# Patient Record
Sex: Male | Born: 1995 | Race: Black or African American | Hispanic: No | Marital: Single | Smoking: Never smoker
Health system: Southern US, Community
[De-identification: ages and names within clinical notes are randomized; demographics above are authoritative.]

---

## 2015-12-05 ENCOUNTER — Emergency Department (HOSPITAL_COMMUNITY)
Admission: EM | Admit: 2015-12-05 | Discharge: 2015-12-05 | Disposition: A | Discharge status: Home / Self Care | Payer: Self-pay | Attending: Emergency Medicine | Admitting: Emergency Medicine

## 2015-12-05 ENCOUNTER — Emergency Department (HOSPITAL_COMMUNITY): Payer: Self-pay

## 2015-12-05 ENCOUNTER — Encounter (HOSPITAL_COMMUNITY): Payer: Self-pay | Admitting: Family Medicine

## 2015-12-05 DIAGNOSIS — R748 Abnormal levels of other serum enzymes: Secondary | ICD-10-CM

## 2015-12-05 DIAGNOSIS — B349 Viral infection, unspecified: Secondary | ICD-10-CM | POA: Insufficient documentation

## 2015-12-05 DIAGNOSIS — K0889 Other specified disorders of teeth and supporting structures: Secondary | ICD-10-CM | POA: Insufficient documentation

## 2015-12-05 DIAGNOSIS — R05 Cough: Secondary | ICD-10-CM

## 2015-12-05 DIAGNOSIS — R7989 Other specified abnormal findings of blood chemistry: Secondary | ICD-10-CM | POA: Insufficient documentation

## 2015-12-05 DIAGNOSIS — R059 Cough, unspecified: Secondary | ICD-10-CM

## 2015-12-05 DIAGNOSIS — R1084 Generalized abdominal pain: Secondary | ICD-10-CM | POA: Insufficient documentation

## 2015-12-05 LAB — CBC
HCT: 43.3 % (ref 39.0–52.0)
Hemoglobin: 15.2 g/dL (ref 13.0–17.0)
MCH: 32.1 pg (ref 26.0–34.0)
MCHC: 35.1 g/dL (ref 30.0–36.0)
MCV: 91.5 fL (ref 78.0–100.0)
Platelets: 215 10*3/uL (ref 150–400)
RBC: 4.73 MIL/uL (ref 4.22–5.81)
RDW: 12.4 % (ref 11.5–15.5)
WBC: 4.5 10*3/uL (ref 4.0–10.5)

## 2015-12-05 LAB — COMPREHENSIVE METABOLIC PANEL
ALBUMIN: 4 g/dL (ref 3.5–5.0)
ALK PHOS: 231 U/L — AB (ref 38–126)
ALT: 19 U/L (ref 17–63)
AST: 25 U/L (ref 15–41)
Anion gap: 14 (ref 5–15)
BILIRUBIN TOTAL: 0.5 mg/dL (ref 0.3–1.2)
BUN: 12 mg/dL (ref 6–20)
CALCIUM: 9 mg/dL (ref 8.9–10.3)
CO2: 22 mmol/L (ref 22–32)
CREATININE: 0.75 mg/dL (ref 0.61–1.24)
Chloride: 104 mmol/L (ref 101–111)
GFR calc Af Amer: >60 mL/min (ref >60)
GFR calc non Af Amer: >60 mL/min (ref >60)
GLUCOSE: 163 mg/dL — AB (ref 65–99)
Potassium: 4 mmol/L (ref 3.5–5.1)
Sodium: 140 mmol/L (ref 135–145)
TOTAL PROTEIN: 7.4 g/dL (ref 6.5–8.1)

## 2015-12-05 LAB — URINALYSIS, ROUTINE W REFLEX MICROSCOPIC
Glucose, UA: NEGATIVE mg/dL
HGB URINE DIPSTICK: NEGATIVE
Ketones, ur: NEGATIVE mg/dL
Leukocytes, UA: NEGATIVE
NITRITE: NEGATIVE
PH: 5.5 (ref 5.0–8.0)
Protein, ur: NEGATIVE mg/dL
SPECIFIC GRAVITY, URINE: 1.028 (ref 1.005–1.030)

## 2015-12-05 LAB — LIPASE, BLOOD: Lipase: 35 U/L (ref 11–51)

## 2015-12-05 MED ORDER — IBUPROFEN 200 MG PO TABS
400.0000 mg | ORAL_TABLET | Freq: Once | ORAL | Status: AC
  Administered 2015-12-05: 400 mg via ORAL
  Filled 2015-12-05: qty 2

## 2015-12-05 NOTE — ED Notes (Signed)
Pt here for all over body pain more in abdomen with vomiting, sts that he has been in the country 5 months.

## 2015-12-05 NOTE — Discharge Instructions (Signed)
°Emergency Department Resource Guide °1) Find a Doctor and Pay Out of Pocket °Although you won't have to find out who is covered by your insurance plan, it is a good idea to ask around and get recommendations. You will then need to call the office and see if the doctor you have chosen will accept you as a new patient and what types of options they offer for patients who are self-pay. Some doctors offer discounts or will set up payment plans for their patients who do not have insurance, but you will need to ask so you aren't surprised when you get to your appointment. ° °2) Contact Your Local Health Department °Not all health departments have doctors that can see patients for sick visits, but many do, so it is worth a call to see if yours does. If you don't know where your local health department is, you can check in your phone book. The CDC also has a tool to help you locate your state's health department, and many state websites also have listings of all of their local health departments. ° °3) Find a Walk-in Clinic °If your illness is not likely to be very severe or complicated, you may want to try a walk in clinic. These are popping up all over the country in pharmacies, drugstores, and shopping centers. They're usually staffed by nurse practitioners or physician assistants that have been trained to treat common illnesses and complaints. They're usually fairly quick and inexpensive. However, if you have serious medical issues or chronic medical problems, these are probably not your best option. ° °No Primary Care Doctor: °- Call [facility] at  832-8000 - they can help you locate a primary care doctor that  accepts your insurance, provides certain services, etc. °- Physician Referral Service- 1-800-533-3463 ° °Chronic Pain Problems: °Organization         Address  Phone   Notes  °[facility]  (336) 297-2271 Patients need to be referred by their primary care doctor.  ° °Medication  Assistance: °Organization         Address  Phone   Notes  °[place] [facility] [address] °[place], [place] [address] (336) 641-8030 --Must be a resident of [place] °-- Must have NO insurance coverage whatsoever (no Medicaid/ Medicare, etc.) °-- The pt. MUST have a primary care doctor that directs their care regularly and follows them in the community °  °[facility]  (866) 331-1348   °[employer]  (888) 892-1162   ° °Agencies that provide inexpensive medical care: °Organization         Address  Phone   Notes  °[facility]  (336) 832-8035   °[facility]    (336) 832-7272   °[facility] [address] °[place], [place] [address] (336) 832-4777   °[facility] [address], °[place] (336) 271-4999   °[facility]    (336) 373-0678   °[facility]    (336) 272-1050   °[facility] ° [address], [place] Phone:  (336) 832-4444, Fax:  (336) 832-4440 Hours of Operation:  9 am - 6 pm, M-F.  Also accepts Medicaid/Medicare and self-pay.  °[facility] ° [address], [place] Phone: (336) 832-3150, Fax: (336) 832-3151. Hours of Operation:  8:30 am - 5:30 pm, M-F.  Also accepts Medicaid and self-pay.  °[facility] [place] [address], [place] Phone: (336) 878-6027   °[facility] [address], [place], [place] (336)723-1848, Ext. 123 Mondays & Thursdays: 7-9 AM.  First 15 patients are seen on a first come, first serve basis. °  ° °Medicaid-accepting [place] Providers: ° °Organization         Address  Phone   Notes  °[facility] [address], [place] (336) 641-2100 Also accepts self-pay patients.  °[facility] [address], [place] ° (336) 856-9996   °[facility] [address], [place]  (336) 288-8857   °[facility] [address] [place] [address], [place] (336) 299-7000   °Veita Bland [address], [place]  ° (336) 373-1557 Only accepts [place] Access Medicaid patients after they have their name applied to their card.  ° °Self-Pay (no insurance) in [place]: ° °Organization         Address  Phone   Notes  °Sickle Cell Patients, [facility] [address], [place] (336) 832-1970   °[facility] [address], [place] (336) 832-4400   °[facility] ° [address], [place] (336) 992-4800   °[facility]/Dr. Osei-Bonsu ° [address] [place] [address], [place] or [address], [place] (336) 841-8500 Phone number for both [place] and [place] locations is the same.  °[facility] [address], [place] (336) 299-0000   °[facility] [address] [place] [address], [place] or [address] (336) 852-7530 °(336) 878-2260   °[facility] [address], [place] (336) 350-1642, phone; (336) 294-5005, fax Sees patients 1st and 3rd Saturday of every month.  Must not qualify for public or private insurance (i.e. Medicaid, Medicare, [place] Health Choice, Veterans' Benefits) • Household income should be no more than 200% of the poverty level •The clinic cannot treat you if you are pregnant or think you are pregnant • Sexually transmitted diseases are not treated at the clinic.  ° ° °Dental Care: °Organization         Address  Phone  Notes  °[place] [facility] [address], [place] (336) 641-6152 Accepts children up to age 21 who are enrolled in Medicaid or [place] Health Choice; pregnant women with a Medicaid card; and children who have applied for Medicaid or [place] Health Choice, but were declined, whose parents can pay a reduced fee at time of service.  °[place]  [facility] [place]  [address], [place] (336) 641-7733 Accepts children up to age 21 who are enrolled in Medicaid or [place] Health Choice; pregnant women with a Medicaid card; and children who have applied for Medicaid or [place] Health Choice, but were declined, whose parents can pay a reduced fee at time of service.  °[facility] ° [address], [place] (336) 641-4533 Patients are seen by appointment only. Walk-ins are not accepted. [facility] will see patients 18 years of age and older. °Monday - Tuesday (8am-5pm) °Most Wednesdays (8:30-5pm) °$30 per visit, cash only  °[facility] ° [address], [place] (336) 641-4533 Patients are seen by appointment only. Walk-ins are not accepted. [facility] will see patients 18 years of age and older. °One   Wednesday Evening (Monthly: Volunteer Based).  $30 per visit, cash only  °[facility]  (919) 537-3737 for adults; Children under age 4, call Graduate Pediatric Dentistry at (919) [phone]. Children aged 4-14, please call (919) [phone] to request a pediatric application. ° Dental services are provided in all areas of dental care including fillings, crowns and bridges, complete and partial dentures, implants, gum treatment, root canals, and extractions. Preventive care is also provided. Treatment is provided to both adults and children. °Patients are selected via a lottery and there is often a waiting list. °  °[facility] [address], °[place] ° (336) 763-8833 [email or web address] °  °[facility] [address], [place], [place] (336)723-1848, Ext. 123 Second and Fourth Thursday of each month, opens at 6:30 AM; Clinic ends at 9 AM.  Patients are seen on a first-come first-served basis, and a limited number are seen during each clinic.  ° °[facility] ° [address], [place], [place] (336) 723-7904    Eligibility Requirements °You must have lived in [place], [place], or [place] counties for at least the last three months. °  You cannot be eligible for state or federal sponsored healthcare insurance, including Veterans Administration, Medicaid, or Medicare. °  You generally cannot be eligible for healthcare insurance through your employer.  °  How to apply: °Eligibility screenings are held every Tuesday and Wednesday afternoon from 1:00 pm until 4:00 pm. You do not need an appointment for the interview!  °[facility] [address], [place], [place] 336-631-2330   °[facility] [facility]  336-342-8273   °[facility]  336-703-3100   °[facility]  336-570-6415   ° °Behavioral Health Resources in the Community: °Intensive Outpatient Programs °Organization         Address  Phone  Notes  °[facility] [address], [place], [place] 336-878-6098   °[facility] [address], [place], [place] 336-832-9800   °[facility] [address], [place], [place] ° 336-882-2125   °[facility] [address],  °[place], [place] 1-800-853-5163 or 336-641-4981   °Substance Abuse Resources °Organization         Address  Phone  Notes  °[facility]  336-882-2125   °[facility]  336-784-9470   °[facility]  336-285-9073   °[facility]  336-845-3988   °Residential & Outpatient Substance Abuse Program  1-800-659-3381   °Psychological Services °Organization         Address  Phone  Notes  °[facility]  336- 832-9600   °[facility]  336- 378-7881   °[facility] [address], [place] 1-800-853-5163 or 336-641-4981   ° °Mobile Crisis Teams °Organization         Address  Phone  Notes  °[facility], Mobile Crisis Care Unit  1-877-626-1772   °Assertive °[facility] ° [address]  [place], [place] 336-834-9664   °Sharon DeEsch [address] °[place] 336-554-5454   ° °Self-Help/Support Groups °Organization         Address  Phone             Notes  °[facility] [place] - variety of support groups  336- 373-1402 Call for more information  °Narcotics Anonymous (NA), [facility] [address], °[place]  2 meetings at this location  ° °  Residential Treatment Programs °Organization         Address  Phone  Notes  °[facility] [address],    °[place]  1-866-801-8205   °[facility] ° [address], [place], [place] 704-293-8524   °[facility] [address], [place] 336-845-3988 Admissions: 8am-3pm M-F  °[facility] [address]    °[place], [place] 336-841-1104   °[facility] [address], [place], [place] 336-379-7146   °[facility] [address]  °[place], [place] 336-285-9073   °[facility] - Intensive Outpatient [address], [place], [place] 336-852-3033   °[facility] [address]  °[place], [place] 1-877-615-2722 or 336-784-9470   °[facility] [address] [place], [place] 336-227-7417 Accepts Medicaid  °[facility] [address]  °[place] 1-800-659-3381 Substance Abuse/Addiction Treatment  ° °[place] Behavioral Health Resources °Organization         Address  Phone  Notes  °[facility]  (888) 581-9988   °Julie Brannon, PhD [address] [place], [place]   (336) 349-5553 or (336) 951-0000   °[facility]   [address] °[place], [place] (336) 349-4454   °[facility] [address], [place], [place] (336) 342-8316 Insurance/Medicaid/sponsorship through [facility]  °[facility] [address]                                    [place], [place] (336) 342-8316 Therapy/tele-psych/case    °[facility] [address]  ° [place],  (336) 349-2233    °Dr. Arfeen  (336) 349-4544   °[facility] [place]  [employer] [place] [facility] 1) [address], [place] °2) [address], [place] °3)  [address], [place] (336) 349-3220 °(336) 342-7768 ° °(336) 342-8140   °[place] [facility] (336) 342-1394 or (336) 342-3537 (After Hours)    ° ° °

## 2015-12-05 NOTE — ED Provider Notes (Signed)
CSN: [id number]     Arrival date & time 12/05/15  1014 History   First MD Initiated Contact with Patient 12/05/15 1321     Chief Complaint  Patient presents with  . Abdominal Pain  . Emesis     (Consider location/radiation/quality/duration/timing/severity/associated sxs/prior Treatment) HPI Comments: Patient presents today with complaints of subjective fever, cough, headache, body aches, and abdominal pain.  He reports onset of symptoms 2 days ago.  He has not taken anything for symptoms prior to arrival.  He reports that his mother and sister have similar symptoms.  Although note from Triage RN states that the patient was complaining of vomiting, when patient asked about vomiting with the Interpretor phone he denies.  He denies diarrhea.  He has not checked his temperature, but states that he has felt febrile.  He denies SOB, ST, vision changes, neck pain/stiffness, chest pain, dizziness, or syncope.  He moved to the [place] from [place] five months ago.  Patient is a 20 y.o. male presenting with abdominal pain and vomiting. The history is provided by the patient. The history is limited by a language barrier. A language interpreter was used Furniture conservator/restorer used).  Abdominal Pain Associated symptoms: vomiting   Emesis Associated symptoms: abdominal pain     History reviewed. No pertinent past medical history. History reviewed. No pertinent past surgical history. History reviewed. No pertinent family history. Social History  Substance Use Topics  . Smoking status: Never Smoker   . Smokeless tobacco: None  . Alcohol Use: No    Review of Systems  Gastrointestinal: Positive for vomiting and abdominal pain.  All other systems reviewed and are negative.     Allergies  Review of patient's allergies indicates no known allergies.  Home Medications   Prior to Admission medications   Not on File   BP 124/73 mmHg  Pulse 78  Temp(Src) 98.4 F (36.9 C)  Resp 18  Wt 62.71 kg  SpO2  100% Physical Exam  Constitutional: He appears well-developed and well-nourished.  HENT:  Head: Normocephalic and atraumatic.  Mouth/Throat: Oropharynx is clear and moist.  Eyes: EOM are normal. Pupils are equal, round, and reactive to light.  Neck: Normal range of motion. Neck supple.  Cardiovascular: Normal rate, regular rhythm and normal heart sounds.   Pulmonary/Chest: Effort normal and breath sounds normal.  Abdominal: Soft. Bowel sounds are normal. There is generalized tenderness. There is no rigidity, no rebound, no guarding and negative Murphy's sign.  Mild diffuse tenderness to palpation of the abdomen  Musculoskeletal: Normal range of motion.  Neurological: He is alert.  Skin: Skin is warm and dry.  Psychiatric: He has a normal mood and affect.  Nursing note and vitals reviewed.   ED Course  Procedures (including critical care time) Labs Review Labs Reviewed  COMPREHENSIVE METABOLIC PANEL - Abnormal; Notable for the following:    Glucose, Bld 163 (*)    Alkaline Phosphatase 231 (*)    All other components within normal limits  LIPASE, BLOOD  CBC  URINALYSIS, ROUTINE W REFLEX MICROSCOPIC (NOT AT [facility])    Imaging Review Dg Chest 2 View  12/05/2015  CLINICAL DATA:  Cough Symptoms for 4 days Fever vomiting EXAM: CHEST  2 VIEW COMPARISON:  None. FINDINGS: Normal mediastinum and cardiac silhouette. Normal pulmonary vasculature. No evidence of effusion, infiltrate, or pneumothorax. No acute bony abnormality. IMPRESSION: No acute cardiopulmonary process. Electronically Signed   By: Genevive Bi M.D.   On: 12/05/2015 14:37   I have personally reviewed  and evaluated these images and lab results as part of my medical decision-making.   EKG Interpretation None      MDM   Final diagnoses:  Cough   Patient presents today with subjective fever, dry cough, body aches, headache, and abdominal pain.  Symptoms most consistent with a viral illness.  Patient tolerating PO  liquids.  No vomiting.  CXR is negative.  Labs unremarkable.  UA is negative.  VSS.  Patient non toxic appearing.  Feel that the patient is stable for discharge.  Return precautions given.    Santiago Glad, PA-C 12/06/15 2121  Geoffery Lyons, MD 12/09/15 Earle Gell

## 2017-02-04 IMAGING — DX DG CHEST 2V
2 series · 2 of 2 positions shown · non-contrast
Comparison: None.

CLINICAL DATA: Cough Symptoms for 4 days Fever vomiting

EXAM:
CHEST  2 VIEW

[w chest pa]
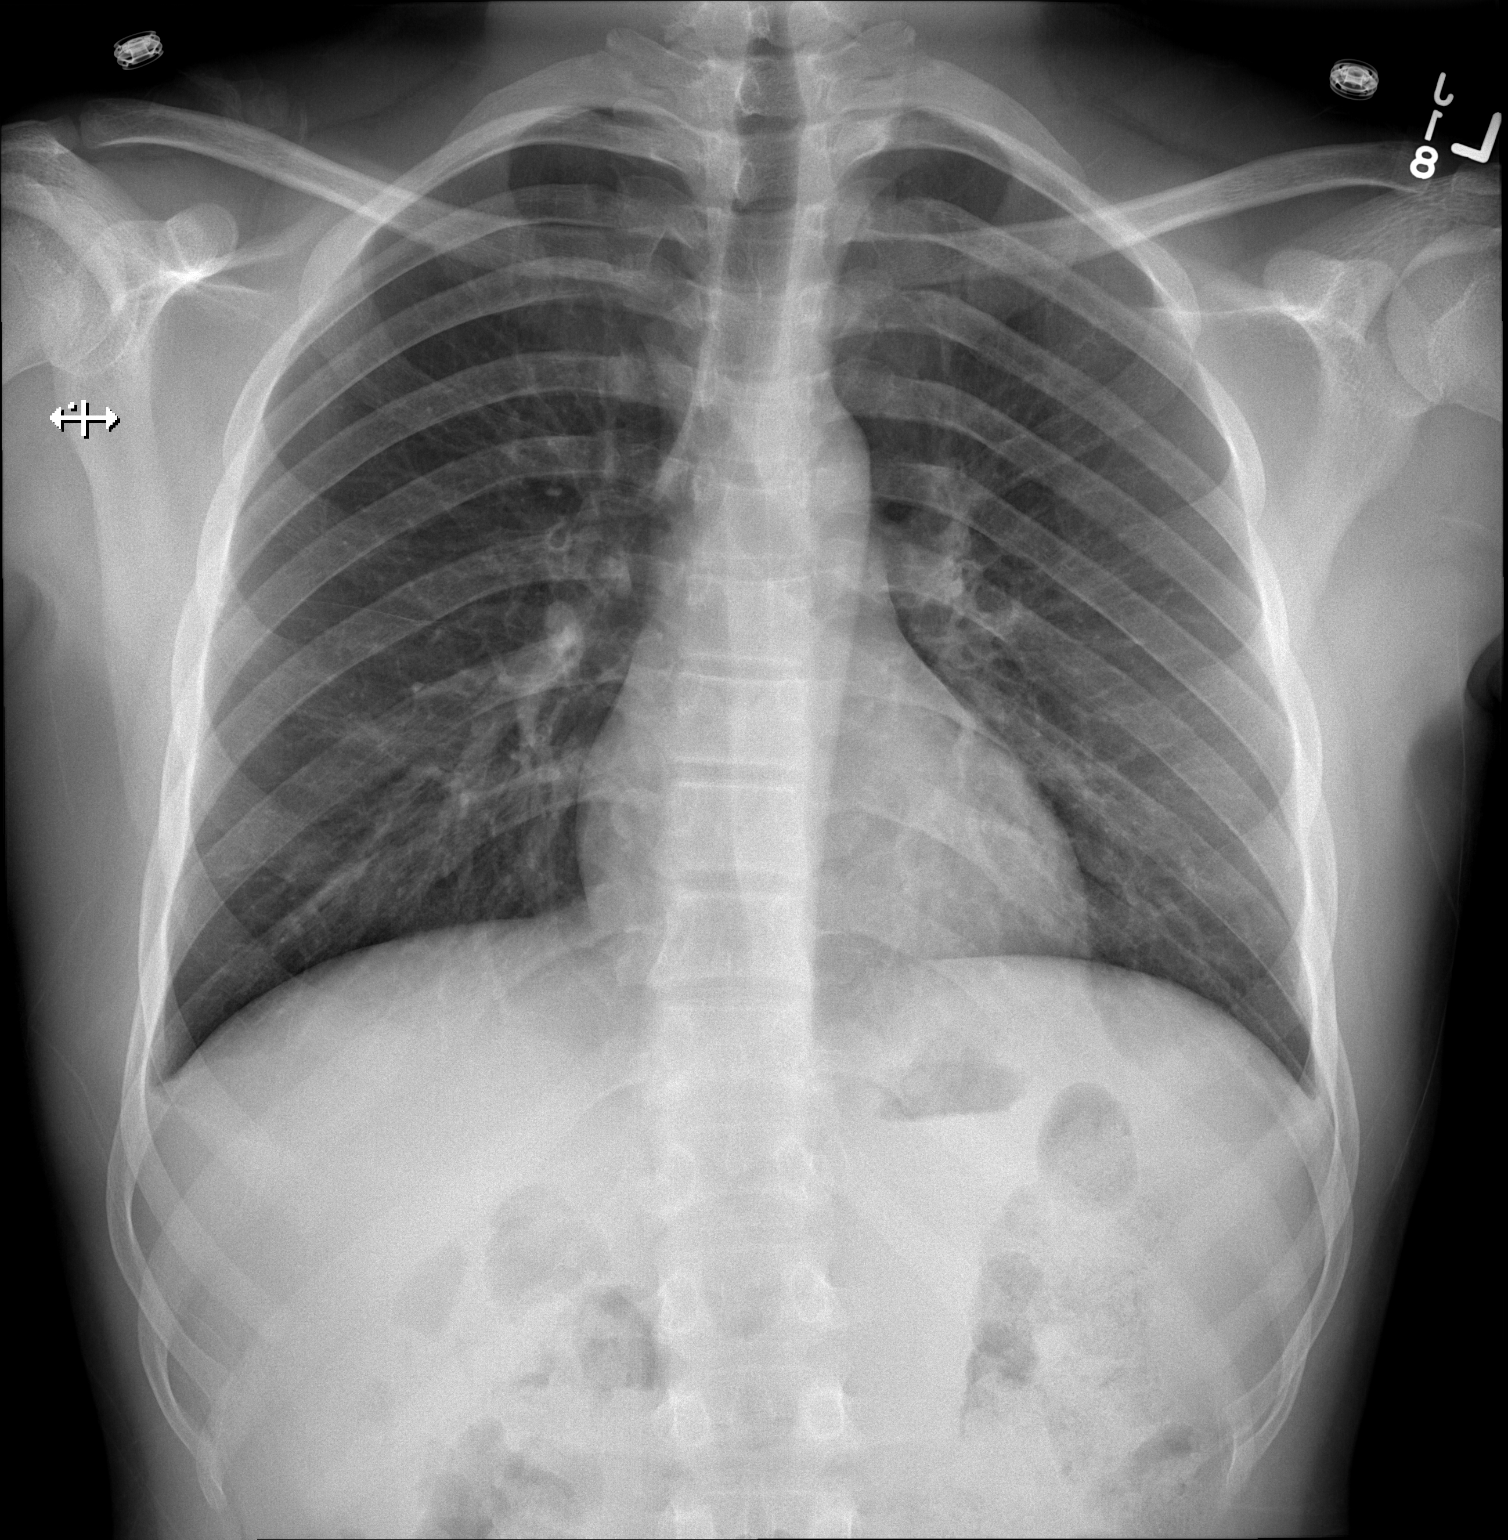

[w chest lat]
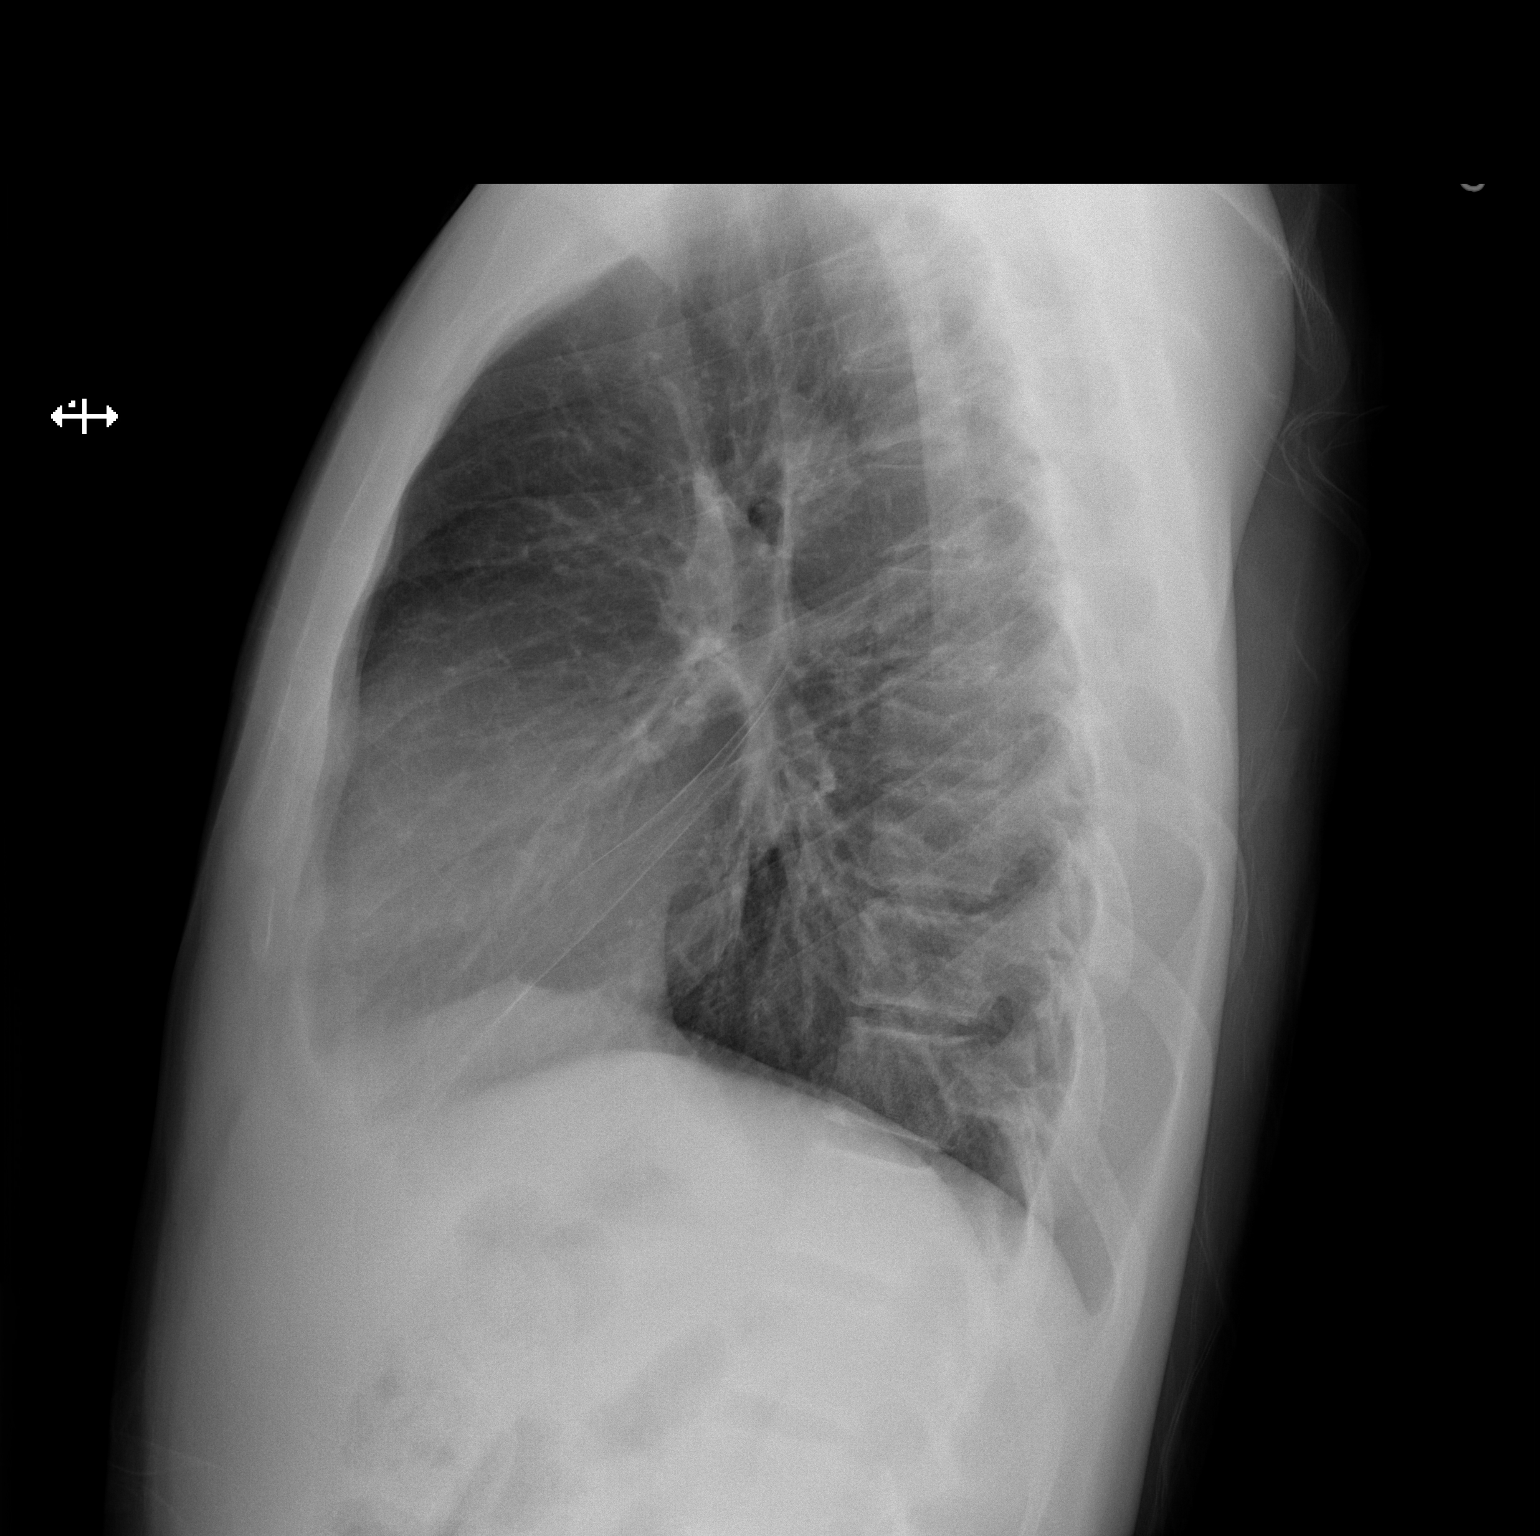

[2 of 2 positions shown; findings below may reference images not displayed]

FINDINGS: Normal mediastinum and cardiac silhouette. Normal pulmonary
vasculature. No evidence of effusion, infiltrate, or pneumothorax.
No acute bony abnormality.
IMPRESSION: No acute cardiopulmonary process.
